# Patient Record
Sex: Male | Born: 1995 | Race: Black or African American | Hispanic: No | Marital: Single | Smoking: Never smoker
Health system: Southern US, Community
[De-identification: ages and names within clinical notes are randomized; demographics above are authoritative.]

## PROBLEM LIST (undated history)

## (undated) DIAGNOSIS — F319 Bipolar disorder, unspecified: Secondary | ICD-10-CM

---
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
## 2017-04-09 ENCOUNTER — Encounter (HOSPITAL_COMMUNITY): Payer: Self-pay | Admitting: Emergency Medicine

## 2017-04-09 ENCOUNTER — Emergency Department (HOSPITAL_COMMUNITY)
Admission: EM | Admit: 2017-04-09 | Discharge: 2017-04-09 | Disposition: A | Discharge status: Home / Self Care | Payer: Self-pay | Attending: Emergency Medicine | Admitting: Emergency Medicine

## 2017-04-09 DIAGNOSIS — Z7689 Persons encountering health services in other specified circumstances: Secondary | ICD-10-CM | POA: Insufficient documentation

## 2017-04-09 DIAGNOSIS — R440 Auditory hallucinations: Secondary | ICD-10-CM | POA: Insufficient documentation

## 2017-04-09 DIAGNOSIS — Z5189 Encounter for other specified aftercare: Secondary | ICD-10-CM

## 2017-04-09 DIAGNOSIS — Z79899 Other long term (current) drug therapy: Secondary | ICD-10-CM | POA: Insufficient documentation

## 2017-04-09 NOTE — ED Provider Notes (Signed)
[facility]-EMERGENCY DEPT Provider Note   CSN: [id number] Arrival date & time: 04/09/17  4098     History   Chief Complaint Chief Complaint  Patient presents with  . Psychiatric Evaluation    HPI Edwin Armstrong is a 22 y.o. male who presents to the emergency department with his mother for a chief complaint of medication adjustment.   The patient's mother reports that he was seen by a therapist yesterday at [facility].  She states that she had made the appointment in an attempt to get his medication adjusted.  She reports that he has been on oral Abilify for the last 3 months, but is concerned that it is not effective.    The patient reports history of chronic auditory hallucinations for the last 3 years. No acute changes in the hallucinations. He states that the voices do not instruct him to harm or kill others. He states that often the voices do not speak [place].  He denies SI, HI, or visual hallucinations.   The patient's mother states that she was told to bring the patient to the emergency department today so that he can speak with a psychiatrist to have his medications adjusted.  Spoke with Shanda Bumps, staff [employer], she reports that the patient endorsed auditory hallucinations with kill commands over the last 3 days during his therapy appointment yesterday and advised the patient and his mother to come immediately to the emergency department for psychiatric evaluation.  The patient's mother states that she was in the room during the therapy appointment, and the patient reported a history of auditory hallucinations with kills commands, but states that the patient told the therapist he was not actively having the symptoms nor had he had symptoms for many months. She states "the therapy asked him the question, and he said he had those symptoms many months ago, and she took it and ran with it."  His mother reports that he has been compliant with his oral Abilify for the last  3 months.  She is unable to state what the providers name that has the medication for the patient is.  The history is provided by the patient. No language interpreter was used.    Past Medical History:  Diagnosis Date  . Bipolar 1 disorder (HCC) suicidal    Patient Active Problem List   Diagnosis Date Noted  . Suicidal overdose (HCC) 03/06/2015  . Major depressive disorder, recurrent, severe without psychotic features (HCC)     History reviewed. No pertinent surgical history.     Home Medications    Prior to Admission medications   Medication Sig Start Date End Date Taking? Authorizing Provider  ARIPiprazole (ABILIFY) 10 MG tablet Take 10 mg by mouth daily.   Yes [provider]    Family History No family history on file.  Social History Social History   Tobacco Use  . Smoking status: Never Smoker  . Smokeless tobacco: Never Used  Substance Use Topics  . Alcohol use: No  . Drug use: No     Allergies   Patient has no known allergies.   Review of Systems Review of Systems  Constitutional: Negative for activity change.  Respiratory: Negative for shortness of breath.   Cardiovascular: Negative for chest pain.  Gastrointestinal: Negative for abdominal pain.  Musculoskeletal: Negative for back pain.  Skin: Negative for rash.   Physical Exam Updated Vital Signs BP 126/73 (BP Location: Right Arm)   Pulse 77   Temp 98.1 F (36.7 C) (Oral)  Resp 18   Ht 5\' 11"  (1.803 m)   SpO2 100%   Physical Exam  Constitutional: He appears well-developed.  HENT:  Head: Normocephalic.  Eyes: Conjunctivae are normal.  Neck: Neck supple.  Cardiovascular: Normal rate and regular rhythm.  No murmur heard. Pulmonary/Chest: Effort normal.  Abdominal: Soft. He exhibits no distension.  Neurological: He is alert.  Skin: Skin is warm and dry.  Psychiatric: His speech is normal and behavior is normal. His affect is blunt. He expresses no homicidal and no suicidal  ideation. He expresses no suicidal plans and no homicidal plans.  Poor eye contact. Endorses chronic auditory hallucinations.   Nursing note and vitals reviewed.   ED Treatments / Results  Labs (all labs ordered are listed, but only abnormal results are displayed) Labs Reviewed - No data to display  EKG  EKG Interpretation None       Radiology No results found.  Procedures Procedures (including critical care time)  Medications Ordered in ED Medications - No data to display   Initial Impression / Assessment and Plan / ED Course  I have reviewed the triage vital signs and the nursing notes.  Pertinent labs & imaging results that were available during my care of the patient were reviewed by me and considered in my medical decision making (see chart for details).     22 year old male with a history of bipolar disorder on Abilify.  The patient has chronic auditory hallucinations.  Both he and his mother deny changes to his symptoms for several months.  His mother is requesting a change to his medications.  Discussed with the patient's mother that a psychiatrist is not available at this time in the ED. Called and spoke to BronaughJessica at [facility] who stated that during the patient's therapy appointment yesterday he endorsed auditory hallucinations with kill commands.  The patient and his mother report that this has not been present for several months.  No SI, HI, or visual hallucinations.  The patient does not meet inpatient criteria.  He does not appear to be a threat to himself or others.  Shanda BumpsJessica reports that appointments with psychiatry can often be made 24-48 hours out.  Discussed with the patient and his mother that the other option would be to have a TTS psychiatry consult in the ED. The patient and his mother state that they feel safe to go home with strict return precautions if symptoms change or worse.  The patient's mother states that she will call and make a follow-up appointment at  [facility] in 4 days when she is off work.  Vital signs stable.  No acute distress.  The patient appears safe for discharge at this time.  Final Clinical Impressions(s) / ED Diagnoses   Final diagnoses:  Encounter for medication adjustment    ED Discharge Orders    None       Layken Beg A, PA-C 04/10/17 0051    Alvira MondaySchlossman, Erin, MD 04/11/17 0003

## 2017-04-09 NOTE — Discharge Instructions (Signed)
Call to make a follow-up appointment at [facility].  To ensure that you are making an appointment with the right person, make sure they schedule you for an appointment with a provider, which may be a physician assistant, a nurse practitioner, or a psychiatrist, all of whom are able to prescribe and make medication adjustments.   If you develop any new or worsening symptoms, including hearing voices that tell you to hurt other people, thoughts of wanting to hurt or kill yourself or others around you, or other concerning symptoms, please return to the emergency department for re-evaluation.

## 2017-04-09 NOTE — ED Triage Notes (Signed)
Per mother-states she took her son to [facility] yesterday for his monthly shot of Abilify-states there was not MD to evaluate him-today, [facility] told her to come to ED for psych eval-patient states he has been taking his oral Abilify but mother doesn't think either of them are working-patient states he hears voices-no SI/HI

## 2017-04-09 NOTE — ED Notes (Signed)
Bed: WA30 Expected date:  Expected time:  Means of arrival:  Comments: 

## 2018-06-21 ENCOUNTER — Emergency Department (HOSPITAL_COMMUNITY)
Admission: EM | Admit: 2018-06-21 | Discharge: 2018-06-21 | Disposition: A | Discharge status: Home / Self Care | Payer: Self-pay | Attending: Emergency Medicine | Admitting: Emergency Medicine

## 2018-06-21 ENCOUNTER — Other Ambulatory Visit: Payer: Self-pay

## 2018-06-21 DIAGNOSIS — F22 Delusional disorders: Secondary | ICD-10-CM | POA: Insufficient documentation

## 2018-06-21 DIAGNOSIS — Z79899 Other long term (current) drug therapy: Secondary | ICD-10-CM | POA: Insufficient documentation

## 2018-06-21 DIAGNOSIS — F323 Major depressive disorder, single episode, severe with psychotic features: Secondary | ICD-10-CM

## 2018-06-21 LAB — ACETAMINOPHEN LEVEL

## 2018-06-21 LAB — CBC WITH DIFFERENTIAL/PLATELET
ABS IMMATURE GRANULOCYTES: 0 10*3/uL (ref 0.00–0.07)
BASOS ABS: 0 10*3/uL (ref 0.0–0.1)
Basophils Relative: 1 %
Eosinophils Absolute: 0.1 10*3/uL (ref 0.0–0.5)
Eosinophils Relative: 2 %
HCT: 42.9 % (ref 39.0–52.0)
Hemoglobin: 14 g/dL (ref 13.0–17.0)
IMMATURE GRANULOCYTES: 0 %
LYMPHS ABS: 1.1 10*3/uL (ref 0.7–4.0)
LYMPHS PCT: 36 %
MCH: 29.7 pg (ref 26.0–34.0)
MCHC: 32.6 g/dL (ref 30.0–36.0)
MCV: 90.9 fL (ref 80.0–100.0)
MONO ABS: 0.3 10*3/uL (ref 0.1–1.0)
MONOS PCT: 9 %
NEUTROS ABS: 1.7 10*3/uL (ref 1.7–7.7)
NEUTROS PCT: 52 %
Platelets: 183 10*3/uL (ref 150–400)
RBC: 4.72 MIL/uL (ref 4.22–5.81)
RDW: 13.3 % (ref 11.5–15.5)
WBC: 3.2 10*3/uL — ABNORMAL LOW (ref 4.0–10.5)
nRBC: 0 % (ref 0.0–0.2)

## 2018-06-21 LAB — COMPREHENSIVE METABOLIC PANEL
ALBUMIN: 4.2 g/dL (ref 3.5–5.0)
ALT: 22 U/L (ref 0–44)
AST: 20 U/L (ref 15–41)
Alkaline Phosphatase: 59 U/L (ref 38–126)
Anion gap: 5 (ref 5–15)
BUN: 11 mg/dL (ref 6–20)
CO2: 26 mmol/L (ref 22–32)
CREATININE: 0.97 mg/dL (ref 0.61–1.24)
Calcium: 9.2 mg/dL (ref 8.9–10.3)
Chloride: 106 mmol/L (ref 98–111)
GFR calc Af Amer: >60 mL/min (ref >60)
GFR calc non Af Amer: >60 mL/min (ref >60)
GLUCOSE: 106 mg/dL — AB (ref 70–99)
POTASSIUM: 4.3 mmol/L (ref 3.5–5.1)
SODIUM: 137 mmol/L (ref 135–145)
Total Bilirubin: 0.6 mg/dL (ref 0.3–1.2)
Total Protein: 7 g/dL (ref 6.5–8.1)

## 2018-06-21 LAB — SALICYLATE LEVEL

## 2018-06-21 LAB — RAPID URINE DRUG SCREEN, HOSP PERFORMED
Amphetamines: NOT DETECTED
BARBITURATES: NOT DETECTED
BENZODIAZEPINES: NOT DETECTED
Cocaine: NOT DETECTED
Opiates: NOT DETECTED
Tetrahydrocannabinol: NOT DETECTED

## 2018-06-21 LAB — ETHANOL

## 2018-06-21 MED ORDER — HALOPERIDOL LACTATE 2 MG/ML PO CONC
2.0000 mg | Freq: Once | ORAL | Status: AC
  Administered 2018-06-21: 2 mg via ORAL
  Filled 2018-06-21: qty 1

## 2018-06-21 MED ORDER — HALOPERIDOL LACTATE 2 MG/ML PO CONC
2.0000 mg | Freq: Every day | ORAL | Status: DC
Start: 2018-06-21 — End: 2018-06-21
  Filled 2018-06-21: qty 1

## 2018-06-21 MED ORDER — HALOPERIDOL LACTATE 2 MG/ML PO CONC: 2.0000 mg | Freq: Every day | ORAL | 0 refills | Status: DC

## 2018-06-21 NOTE — ED Notes (Signed)
Per Jorene Minors NP give pt Haldol as ordered, monitor for 1 hour, then pt ok to discharge home

## 2018-06-21 NOTE — ED Provider Notes (Signed)
[facility]-EMERGENCY DEPT Provider Note   CSN: [id number] Arrival date & time: 06/21/18  1256    History   Chief Complaint Chief Complaint  Patient presents with  . Psychiatric Evaluation    HPI Edwin Armstrong is a 23 y.o. male past medical history depression, bipolar 1 disorder, presenting to the emergency department voluntarily for psychiatric evaluation.  Patient states he has been hearing auditory hallucinations, which are telling him he is "worthless" and that he is "insignificant in this large universe."  He endorsed to the nursing staff that he has paranoia that his neighbors are trying to kill him, and that he thinks there may be some poison in his foods.  Symptoms have been ongoing worsening for 2 weeks.  He states recently he has been having trouble with "memory loss", where he states he is doing a task such as going to the kitchen but forgets why he is there. He does not take any daily medication.  Denies actions or feelings of self-harm or suicide, denies HI.  Denies drug or alcohol use.     The history is provided by the patient.    Past Medical History:  Diagnosis Date  . Bipolar 1 disorder (HCC) suicidal    Patient Active Problem List   Diagnosis Date Noted  . Suicidal overdose (HCC) 03/06/2015  . Major depressive disorder, recurrent, severe without psychotic features (HCC)     No past surgical history on file.      Home Medications    Prior to Admission medications   Medication Sig Start Date End Date Taking? Authorizing Provider  ARIPiprazole (ABILIFY) 10 MG tablet Take 10 mg by mouth daily.    [provider]    Family History No family history on file.  Social History Social History   Tobacco Use  . Smoking status: Never Smoker  . Smokeless tobacco: Never Used  Substance Use Topics  . Alcohol use: No  . Drug use: No     Allergies   Patient has no known allergies.   Review of Systems Review of Systems   Psychiatric/Behavioral: Positive for dysphoric mood and hallucinations. Negative for self-injury and suicidal ideas. The patient is not nervous/anxious.   All other systems reviewed and are negative.    Physical Exam Updated Vital Signs BP (!) 143/84 (BP Location: Right Arm)   Pulse 66   Temp 98.4 F (36.9 C) (Oral)   Resp 19   Ht 5\' 11"  (1.803 m)   SpO2 97%   Physical Exam Vitals signs and nursing note reviewed.  Constitutional:      General: He is not in acute distress.    Appearance: He is well-developed. He is not ill-appearing.  HENT:     Head: Normocephalic and atraumatic.  Eyes:     Extraocular Movements: Extraocular movements intact.     Conjunctiva/sclera: Conjunctivae normal.     Pupils: Pupils are equal, round, and reactive to light.  Cardiovascular:     Rate and Rhythm: Normal rate and regular rhythm.  Pulmonary:     Effort: Pulmonary effort is normal. No respiratory distress.     Breath sounds: Normal breath sounds.  Abdominal:     Palpations: Abdomen is soft.  Skin:    General: Skin is warm.  Neurological:     Mental Status: He is alert.  Psychiatric:        Attention and Perception: Attention normal.        Mood and Affect: Affect is flat.  Behavior: Behavior normal. Behavior is cooperative.        Thought Content: Thought content is paranoid and delusional. Thought content does not include homicidal or suicidal ideation.      ED Treatments / Results  Labs (all labs ordered are listed, but only abnormal results are displayed) Labs Reviewed  COMPREHENSIVE METABOLIC PANEL - Abnormal; Notable for the following components:      Result Value   Glucose, Bld 106 (*)    All other components within normal limits  CBC WITH DIFFERENTIAL/PLATELET - Abnormal; Notable for the following components:   WBC 3.2 (*)    All other components within normal limits  RAPID URINE DRUG SCREEN, HOSP PERFORMED  ETHANOL  SALICYLATE LEVEL  ACETAMINOPHEN LEVEL     EKG None  Radiology No results found.  Procedures Procedures (including critical care time)  Medications Ordered in ED Medications - No data to display   Initial Impression / Assessment and Plan / ED Course  I have reviewed the triage vital signs and the nursing notes.  Pertinent labs & imaging results that were available during my care of the patient were reviewed by me and considered in my medical decision making (see chart for details).        Patient presenting voluntarily with complaint of AVH and paranoia, worsening x2 weeks.  Denies SI or HI.  He is calm and cooperative on exam.  Screening labs obtained.  UDS is negative.  At this time, patient is medically cleared for TTS evaluation and recommended disposition.  Final Clinical Impressions(s) / ED Diagnoses   Final diagnoses:  None    ED Discharge Orders    None       Lakevia Perris, [place] N, PA-C 06/21/18 1505    Lorre Nick, MD 06/24/18 [phone]

## 2018-06-21 NOTE — ED Notes (Signed)
Pt d/c home per MD order. Discharge summary reviewed with pt. Pt verbalizes understanding. Pt denies SI/HI. RX provided. Personal property returned. Ambulatory off unit. Pt aunt at hospital for discharge.

## 2018-06-21 NOTE — BH Assessment (Signed)
[facility] Assessment Progress Note Patient is requesting to be discharged this date. Patient was seen by Cresenciano Genre who evaluated patient for possible medication interventions to assist with symptom management  prior to discharge. This Clinical research associate spoke with patient's father Earlene Plater [phone]) with whom patient resides to gather collateral information with patient's permission. Earlene Plater stated he felt patient was safe to discharge with parent reporting he did not believe patient was a threat to himself or anyone else. Earlene Plater stated he would assist with transportation back to his residence if needed.

## 2018-06-21 NOTE — [facility] Suicide Risk Assessment (Signed)
Suicide Risk Assessment  Discharge Assessment   [facility] Discharge Suicide Risk Assessment   Principal Problem: Depression, psychotic (HCC) Discharge Diagnoses: Principal Problem:   Depression, psychotic (HCC)   Total Time spent with patient: 45 minutes  Musculoskeletal: Strength & Muscle Tone: within normal limits Gait & Station: normal Patient leans: N/A  Psychiatric Specialty Exam:   Blood pressure (!) 143/84, pulse 66, temperature 98.4 F (36.9 C), temperature source Oral, resp. rate 19, height 5\' 11"  (1.803 m), SpO2 97 %.There is no height or weight on file to calculate BMI.  General Appearance: Casual  Eye Contact::  Good  Speech:  Normal Rate409  Volume:  Normal  Mood:  Depressed  Affect:  Blunt  Thought Process:  Coherent and Descriptions of Associations: Intact  Orientation:  Full (Time, Place, and Person)  Thought Content:  Hallucinations: Auditory  Suicidal Thoughts:  No  Homicidal Thoughts:  No  Memory:  Immediate;   Good Recent;   Good Remote;   Good  Judgement:  Good  Insight:  Good  Psychomotor Activity:  Normal  Concentration:  Good  Recall:  Good  Fund of Knowledge:Good  Language: Good  Akathisia:  No  Handed:  Right  AIMS (if indicated):     Assets:  Communication Skills Desire for Improvement Housing Leisure Time Physical Health Resilience Social Support Transportation Vocational/Educational  Sleep:     Cognition: WNL  ADL's:  Intact   Mental Status Per Nursing Assessment::   On Admission:   23 yo male who presented to the ED with hallucinations.  Intermittent and not command type.  Denies suicidal/homicidal ideations and substance abuse.  His father was contacted by TTS, Mr Earlene Plater, who has no safety concerns and does not want him hospitalized nor does the patient want to be.  Contracts for safety and wants to go to work tomorrow.  Does not like to swallow pills but agreeable to a liquid.  Does not feel depressed but told in the past he was  depressed because sometimes, "I feel nothing at all."  Agreeable to an antipsychotic and will start in ED and stay a few hours to check for adverse reactions.  Demographic Factors:  Male and Adolescent or young adult  Loss Factors: NA  Historical Factors: NA  Risk Reduction Factors:   Sense of responsibility to family, Living with another person, especially a relative and Positive social support  Continued Clinical Symptoms:  Depression, mild  Cognitive Features That Contribute To Risk:  None    Suicide Risk:  Minimal: No identifiable suicidal ideation.  Patients presenting with no risk factors but with morbid ruminations; may be classified as minimal risk based on the severity of the depressive symptoms   Plan Of Care/Follow-up recommendations:  Depression with psychosis: -Started Haldol 2 mg daily Activity:  as tolerated Diet:  heart healthy diet  Edwin Forcier, NP 06/21/2018, 4:46 PM

## 2018-06-21 NOTE — ED Notes (Signed)
TTS at bedside. 

## 2018-06-21 NOTE — ED Notes (Signed)
Pt aunt called nurses station per pt request (consent to release information on chart) Informs this nurse she is on her way to pick up pt for discharge.

## 2018-06-21 NOTE — Discharge Instructions (Signed)
For your behavioral health needs you are advised to follow up with [facility].  Call the phone number listed below at your earliest opportunity to schedule an intake appointment:       [facility]      [address]      [place], [place] [address]      [phone]

## 2018-06-21 NOTE — ED Provider Notes (Signed)
Assumed from PA [place] Robinson at shift change.  Please see her note for full details, but in brief Edwin Armstrong is a 23 y.o. male who presents with auditory and visual hallucinations and reported paranoia.  He denies any SI or HI.  Was medically cleared by prior provider, awaiting TTS evaluation for further recommendations and disposition.  Patient is here voluntarily.  TTS has seen and evaluated patient and case was discussed with Nanine Means NP.  Feel that patient is stable for discharge home, psychiatry team has also discussed with patient's father whom he resides with and can contract for the patient safety.  Outpatient resources have been provided.  At this time patient is stable for discharge home.  Patient is in agreement with this plan.   Dartha Lodge, PA-C 06/21/18 1701    Virgina Norfolk, DO 06/21/18 1814

## 2018-06-21 NOTE — ED Notes (Signed)
Bed: [facility] Expected date:  Expected time:  Means of arrival:  Comments: Triage

## 2018-06-21 NOTE — ED Notes (Signed)
Provider at bedside

## 2018-06-21 NOTE — ED Triage Notes (Addendum)
Pt to room #40. Pt speech slow, thought blocking noted. Pt endorsing paranoid delusions. Reports he thinks his neighbors are trying to kill him. Also states he thinks poison is in some foods. Pt denies SI/HI. Reports hx of suicide attempt by OD "years ago"  Endorsing AVH. Pt reports he lives at home with his parents and works at [employer]. Reports mental health hx of depression with psychotic features, reports he does not take medication. Encourgement and support provided. Special checks q 15 mins in place for safety, Video monitoring in place. Will continue to monitor.

## 2019-01-27 ENCOUNTER — Encounter (HOSPITAL_COMMUNITY): Payer: Self-pay | Admitting: Emergency Medicine

## 2019-01-27 ENCOUNTER — Emergency Department (HOSPITAL_COMMUNITY)
Admission: EM | Admit: 2019-01-27 | Discharge: 2019-01-27 | Disposition: A | Discharge status: Home / Self Care | Payer: Self-pay | Attending: Emergency Medicine | Admitting: Emergency Medicine

## 2019-01-27 ENCOUNTER — Emergency Department (HOSPITAL_COMMUNITY): Payer: Self-pay

## 2019-01-27 ENCOUNTER — Other Ambulatory Visit: Payer: Self-pay

## 2019-01-27 DIAGNOSIS — T434X5A Adverse effect of butyrophenone and thiothixene neuroleptics, initial encounter: Secondary | ICD-10-CM | POA: Insufficient documentation

## 2019-01-27 DIAGNOSIS — G2402 Drug induced acute dystonia: Secondary | ICD-10-CM | POA: Insufficient documentation

## 2019-01-27 LAB — CBC WITH DIFFERENTIAL/PLATELET
Abs Immature Granulocytes: 0.01 10*3/uL (ref 0.00–0.07)
Basophils Absolute: 0 10*3/uL (ref 0.0–0.1)
Basophils Relative: 0 %
Eosinophils Absolute: 0 10*3/uL (ref 0.0–0.5)
Eosinophils Relative: 0 %
HCT: 41.9 % (ref 39.0–52.0)
Hemoglobin: 14 g/dL (ref 13.0–17.0)
Immature Granulocytes: 0 %
Lymphocytes Relative: 17 %
Lymphs Abs: 1 10*3/uL (ref 0.7–4.0)
MCH: 29.5 pg (ref 26.0–34.0)
MCHC: 33.4 g/dL (ref 30.0–36.0)
MCV: 88.2 fL (ref 80.0–100.0)
Monocytes Absolute: 0.3 10*3/uL (ref 0.1–1.0)
Monocytes Relative: 4 %
Neutro Abs: 4.9 10*3/uL (ref 1.7–7.7)
Neutrophils Relative %: 79 %
Platelets: 202 10*3/uL (ref 150–400)
RBC: 4.75 MIL/uL (ref 4.22–5.81)
RDW: 13 % (ref 11.5–15.5)
WBC: 6.2 10*3/uL (ref 4.0–10.5)
nRBC: 0 % (ref 0.0–0.2)

## 2019-01-27 LAB — BASIC METABOLIC PANEL
Anion gap: 9 (ref 5–15)
BUN: 8 mg/dL (ref 6–20)
CO2: 22 mmol/L (ref 22–32)
Calcium: 9 mg/dL (ref 8.9–10.3)
Chloride: 104 mmol/L (ref 98–111)
Creatinine, Ser: 1 mg/dL (ref 0.61–1.24)
GFR calc Af Amer: >60 mL/min (ref >60)
GFR calc non Af Amer: >60 mL/min (ref >60)
Glucose, Bld: 107 mg/dL — ABNORMAL HIGH (ref 70–99)
Potassium: 4 mmol/L (ref 3.5–5.1)
Sodium: 135 mmol/L (ref 135–145)

## 2019-01-27 LAB — TROPONIN I (HIGH SENSITIVITY): Troponin I (High Sensitivity): 2 ng/L (ref <18)

## 2019-01-27 MED ORDER — DIPHENHYDRAMINE HCL 50 MG/ML IJ SOLN
25.0000 mg | Freq: Once | INTRAMUSCULAR | Status: AC
  Administered 2019-01-27: 25 mg via INTRAVENOUS
  Filled 2019-01-27: qty 1

## 2019-01-27 MED ORDER — DIPHENHYDRAMINE HCL 25 MG PO TABS: 25.0000 mg | ORAL_TABLET | Freq: Four times a day (QID) | ORAL | 0 refills | Status: AC | PRN

## 2019-01-27 NOTE — Discharge Instructions (Addendum)
You likely had a reaction to your haldol medication.  Your jaw tightening and clenching can be a type of "dystonic" reaction.  Please read over the instructions attached.  You should STOP taking haldol now and in the future.  Remember to tell all future doctors that you have this reaction to haldol, so you are not accidentally prescribed this again - or a similar drug to it.  You can continue taking Benadryl once every 6 hours as needed if you continue to have symptoms, for the next 48 hours.  Do NOT take benadryl more often than prescribed!  If your symptoms are worsening despite Benadryl, you should return to the emergency department.  If you have difficulty breathing, you should also return to the emergency department.  Finally, it is very important that you recheck your psychiatrist.  You may need to be started on different psychiatric medications, especially if you have not spoken to your psychiatrist in several months.  Please note, also -  Your workup today included an ECG.  This is a rhythm strip of your heart.  We did see some mild abnormalities on this, as we discussed in the room.  I recommended that you follow-up with a cardiologist.  Someone from their office should reach out to you tomorrow or Monday to arrange for outpatient follow-up.  If you do not hear from them by Monday, you can call the number listed above for the heart care clinic.

## 2019-01-27 NOTE — ED Provider Notes (Signed)
[facility]-EMERGENCY DEPT Provider Note   CSN: [id number] Arrival date & time: 01/27/19  1327     History   Chief Complaint Chief Complaint  Patient presents with  . Shortness of Breath  . jaw locking up    HPI Jaion Lagrange is a 23 y.o. male past medical history of bipolar disorder, on Haldol, presenting to the emergency department with jaw tightness.  Patient reports that he took a dose of Haldol yesterday evening after not having taken his medication for a long time.  He reports that he woke up this morning feeling like his jaw was very tight.  He is having spasms and clenching in his jaw.  Feels he has difficulty opening and closing his jaw at different times.  He denies any tongue swelling or difficulty swallowing.  He believes he has difficulty speaking because of his jaw tightness.  He was reporting some shortness of breath which he believes is related to his jaw being tight, and denies that he feels he is having a difficult time getting air into his lungs, or that he has any swelling in his throat.  He states this never happened to him before.  He took no other medications.   He denies visual changes.  He denies any weakness of the upper or lower extremities.  He denies any nausea or vomiting.  He reports he did self-inflicted superficial wound to his left forearm yesterday.  He he says he cannot remember what he used to cut himself.  He otherwise denies any dirty wounds or puncture wounds.  He denies any contact with rusty instruments or nails.  Denies any exposure to soil.  He denies any exposure to canned home goods.  He denies any recent fevers, chills, cough, viral URI type symptoms.  NKDA    HPI  Past Medical History:  Diagnosis Date  . Bipolar 1 disorder (HCC) suicidal    Patient Active Problem List   Diagnosis Date Noted  . Depression, psychotic (HCC) 06/21/2018    History reviewed. No pertinent surgical history.      Home Medications     Prior to Admission medications   Medication Sig Start Date End Date Taking? Authorizing Provider  haloperidol (HALDOL) 2 MG/ML solution Take 1 mL (2 mg total) by mouth daily at 8 pm. May take twice daily if needed 06/21/18 01/27/19 Yes Lord, Herminio Heads, NP  diphenhydrAMINE (BENADRYL) 25 MG tablet Take 1 tablet (25 mg total) by mouth every 6 (six) hours as needed for allergies. 01/27/19   Terald Sleeper, MD    Family History No family history on file.  Social History Social History   Tobacco Use  . Smoking status: Never Smoker  . Smokeless tobacco: Never Used  Substance Use Topics  . Alcohol use: No  . Drug use: No     Allergies   Haldol [haloperidol]   Review of Systems Review of Systems  Constitutional: Negative for chills and fever.  HENT: Positive for facial swelling and voice change. Negative for dental problem, sore throat and trouble swallowing.   Eyes: Negative for photophobia and visual disturbance.  Respiratory: Positive for shortness of breath. Negative for cough.   Cardiovascular: Negative for chest pain and palpitations.  Gastrointestinal: Negative for abdominal pain and vomiting.  Genitourinary: Negative for dysuria.  Musculoskeletal: Positive for joint swelling and myalgias. Negative for arthralgias, back pain, neck pain and neck stiffness.  Skin: Negative for color change and rash.  Neurological: Negative for syncope, light-headedness  and headaches.  All other systems reviewed and are negative.    Physical Exam Updated Vital Signs BP (!) 134/97   Pulse 84   Temp 98.5 F (36.9 C)   Resp 19   SpO2 99%   Physical Exam Vitals signs and nursing note reviewed.  Constitutional:      Appearance: He is well-developed.     Comments: Jaw clenched  HENT:     Head: Normocephalic and atraumatic.     Jaw: Trismus and pain on movement present. No swelling or malocclusion.     Salivary Glands: Right salivary gland is not diffusely enlarged. Left salivary  gland is not diffusely enlarged.     Comments: Tension of the bilateral masseter muscles Jaw clenched, able to open Speech mildly distorted by jaw clench    Mouth/Throat:     Lips: Pink.     Mouth: Mucous membranes are moist.     Tongue: No lesions. Tongue does not deviate from midline.     Pharynx: Oropharynx is clear. Uvula midline. No pharyngeal swelling or oropharyngeal exudate.  Eyes:     Conjunctiva/sclera: Conjunctivae normal.  Neck:     Musculoskeletal: Neck supple.  Cardiovascular:     Rate and Rhythm: Normal rate and regular rhythm.     Heart sounds: No murmur.  Pulmonary:     Effort: Pulmonary effort is normal. No respiratory distress.     Breath sounds: Normal breath sounds.  Abdominal:     Palpations: Abdomen is soft.     Tenderness: There is no abdominal tenderness.  Skin:    General: Skin is warm and dry.     Comments: Superficial 3 cm linear horizontal laceration over left medial arm  Neurological:     General: No focal deficit present.     Mental Status: He is alert and oriented to person, place, and time.     Cranial Nerves: No cranial nerve deficit.     Motor: No weakness.     Comments: No ptosis      ED Treatments / Results  Labs (all labs ordered are listed, but only abnormal results are displayed) Labs Reviewed  BASIC METABOLIC PANEL - Abnormal; Notable for the following components:      Result Value   Glucose, Bld 107 (*)    All other components within normal limits  CBC WITH DIFFERENTIAL/PLATELET  TROPONIN I (HIGH SENSITIVITY)    EKG EKG Interpretation  Date/Time:  Thursday January 27 2019 13:43:59 EDT Ventricular Rate:  111 PR Interval:    QRS Duration: 81 QT Interval:  308 QTC Calculation: 419 R Axis:   106 Text Interpretation: Sinus tachycardia Borderline right axis deviation ST elevation suggests acute pericarditis SINCE LAST TRACING HEART RATE HAS INCREASED Confirmed by Malvin Johns [id number] on 01/27/2019 1:47:51 PM   Radiology  Dg Chest 2 View  Result Date: 01/27/2019 CLINICAL DATA:  Shortness of breath.  Seizure. EXAM: CHEST - 2 VIEW COMPARISON:  September 06, 2008 FINDINGS: Lungs are clear. Heart size and pulmonary vascularity are normal. No adenopathy. No pneumothorax. No bone lesions. IMPRESSION: No edema or consolidation. Electronically Signed   By: Lowella Grip III M.D.   On: 01/27/2019 15:46    Procedures Ultrasound ED Echo  Date/Time: 01/27/2019 4:09 PM Performed by: Wyvonnia Dusky, MD Authorized by: Wyvonnia Dusky, MD   Procedure details:    Indications comment:  Evaluate for effusion   Views: parasternal long axis view and parasternal short axis view     Images:  archived     Limitations:  Acoustic shadowing and body habitus Findings:    Pericardium comment:  No significant pericardial effusion   LV Function: normal (>50% EF)     (including critical care time)  Medications Ordered in ED Medications  diphenhydrAMINE (BENADRYL) injection 25 mg (25 mg Intravenous Given 01/27/19 1603)  diphenhydrAMINE (BENADRYL) injection 25 mg (25 mg Intravenous Given 01/27/19 1718)     Initial Impression / Assessment and Plan / ED Course  I have reviewed the triage vital signs and the nursing notes.  Pertinent labs & imaging results that were available during my care of the patient were reviewed by me and considered in my medical decision making (see chart for details).  23 yo male taking haldol presenting with acute onset jaw stiffness and clenching beginning this morning.  Clinically this is most consistent with a dystonic reaction to haldol.  Will give IV benadryl and reassess  I have lower concern for tetanus.  He has no clear nidus for infection - his left forearm laceration is quite superficial and unlikely to have caused tetanus, although he cannot recall the instrument used.  No other rust exposure, dirt exposure, honey or canned good exposure.  Presentation also inconsistent with stroke or  facial infection.  No sign of parotitis or ludwig's angina.  No recent trauma to suggest jaw dislocation; he is able to range jaw with active effort.  On arrival patient had ECG for screening due to reported "Shortness of breath."  ECG showed findings consistent with pericarditits (diffuse ST elevations and PR depressions).  However the patient has NO other reported signs or symptoms of this disease process, including chest pain, cough, recent viral URI, or shortness of breath.  His bedside echo does not show a large pericardial effusion (although views were too limited to definitively rule out pericardial effusion).  Will discuss this with cardiology and check troponin level while patient is here.   Clinical Course as of Jan 27 133  Thu Jan 27, 2019  1705 Significant improvement in lockjaw symptom, patient speaking clearly now and feels like his jaws have relaxed. I suspect this was a dystonic reaction to Haldol. We discussed discontinuation of his Haldol. He left to reach out to his psychiatrist for alternative medications. He tells me he was only taking the Haldol as needed, this was not a daily prescription for him, and he was using left overs from an old prescription.  He will contact his psychiatrist   [MT]  1800 Feeling better, we will discharge him home with a few tablets of Benadryl.  We discussed the risks of taking too much Benadryl.  I emphasized taking only the Benadryl as prescribed every 6 hours, and only as needed if he continues have symptoms for next day or 2.   [MT]  1800 Also reach out to cardiology to arrange an outpatient follow-up visit given his EKG abnormalities.  Clinically does not appear to be pericarditis or myocarditis.     [MT]    Clinical Course User Index [MT] Ladavia Lindenbaum, Kermit BaloMatthew J, MD    Final Clinical Impressions(s) / ED Diagnoses   Final diagnoses:  Dystonic drug reaction    ED Discharge Orders         Ordered    diphenhydrAMINE (BENADRYL) 25 MG tablet  Every  6 hours PRN     01/27/19 1805           Terald Sleeperrifan, Gini Caputo J, MD 01/28/19 0134

## 2019-01-27 NOTE — ED Triage Notes (Signed)
Pt c/o SOB and jaw locking up/cliniching up on him that started today.

## 2019-01-27 NOTE — ED Notes (Signed)
Discharge paperwork reviewed with pt, including prescription.  Pt verbalized understanding, had no questions at this time.  Pt ambulatory off unit, dad is en route to pick up pt.

## 2019-02-02 ENCOUNTER — Ambulatory Visit: Payer: Self-pay | Admitting: Cardiology

## 2020-03-28 IMAGING — CR DG CHEST 2V
3 series · 3 of 3 positions shown · non-contrast
Comparison: September 06, 2008

CLINICAL DATA: Shortness of breath.  Seizure.

EXAM:
CHEST - 2 VIEW

[w chest lat (1 of 2)]
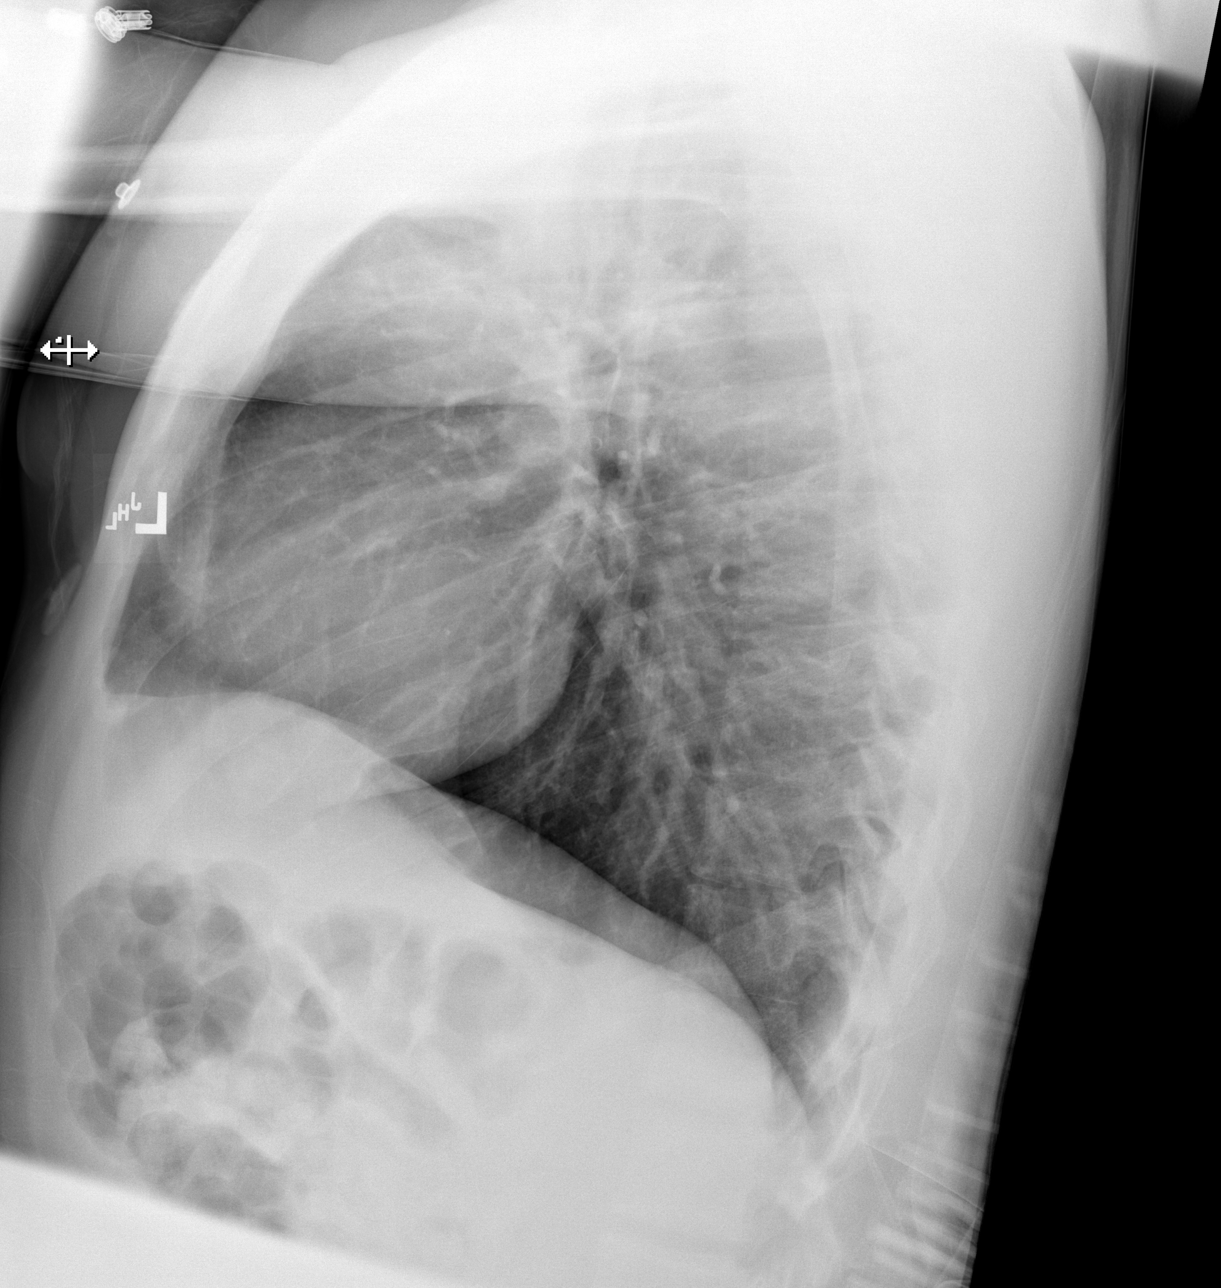

[w chest lat (2 of 2)]
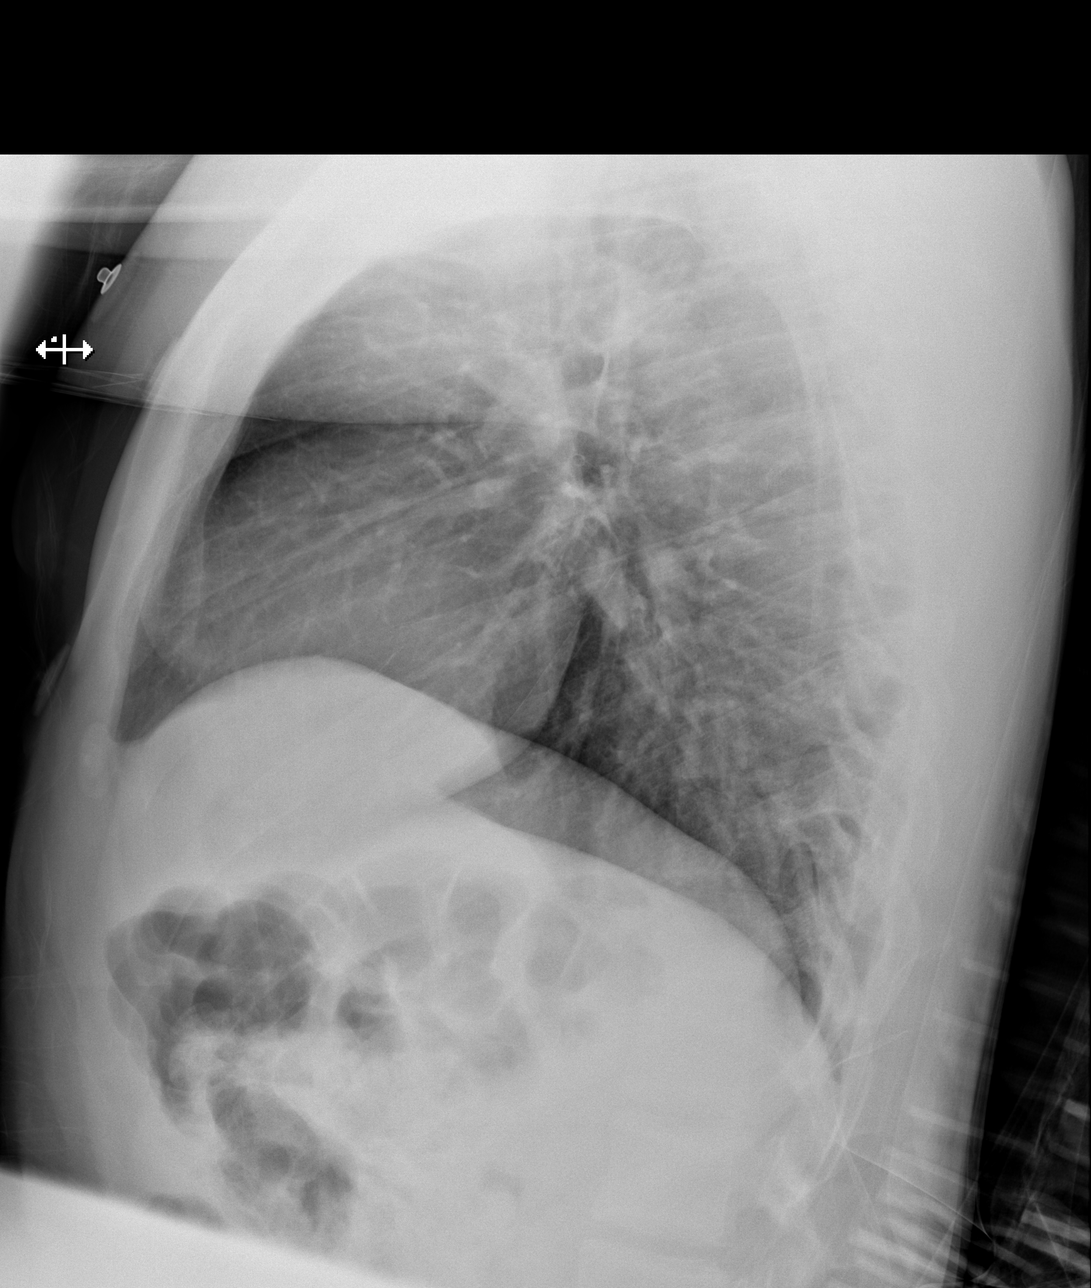

[x chest ap]
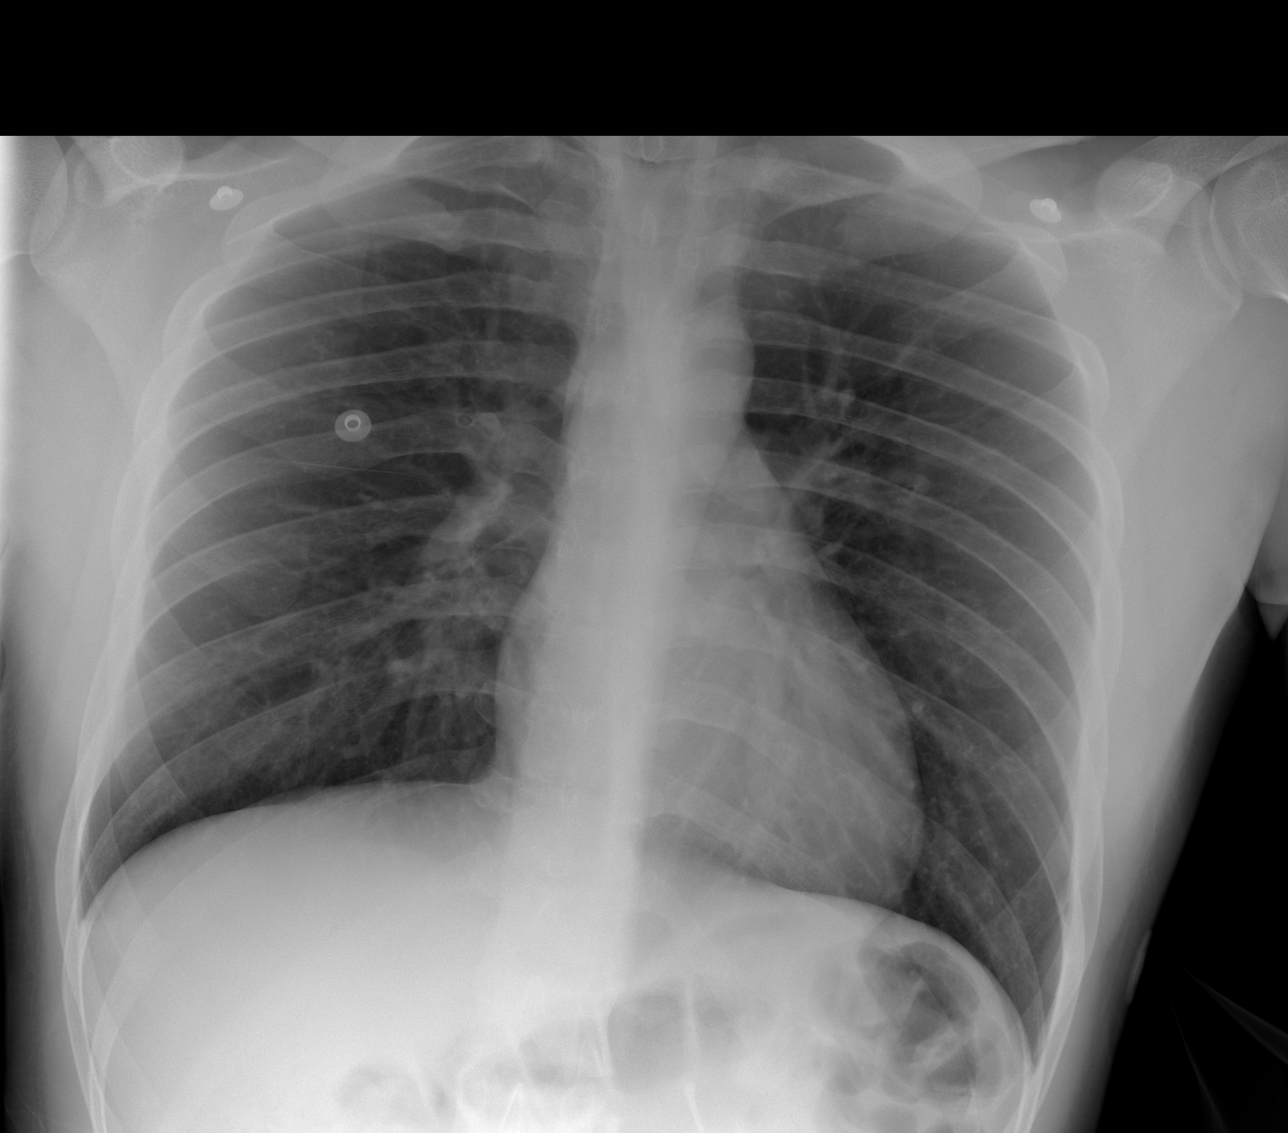

[3 of 3 positions shown; findings below may reference images not displayed]

FINDINGS: Lungs are clear. Heart size and pulmonary vascularity are normal. No
adenopathy. No pneumothorax. No bone lesions.
IMPRESSION: No edema or consolidation.
